# Patient Record
Sex: Male | Born: 1995 | Hispanic: Yes | Marital: Single | Smoking: Never smoker
Health system: Southern US, Community
[De-identification: ages and names within clinical notes are randomized; demographics above are authoritative.]

## PROBLEM LIST (undated history)

## (undated) DIAGNOSIS — Z789 Other specified health status: Secondary | ICD-10-CM

## (undated) DIAGNOSIS — S99911A Unspecified injury of right ankle, initial encounter: Secondary | ICD-10-CM

## (undated) HISTORY — PX: NASAL SEPTUM SURGERY: SHX37

---

## 2015-08-24 ENCOUNTER — Emergency Department (HOSPITAL_COMMUNITY): Payer: Self-pay

## 2015-08-24 ENCOUNTER — Emergency Department (HOSPITAL_COMMUNITY)
Admission: EM | Admit: 2015-08-24 | Discharge: 2015-08-25 | Disposition: A | Discharge status: Home / Self Care | Payer: Self-pay | Attending: Emergency Medicine | Admitting: Emergency Medicine

## 2015-08-24 ENCOUNTER — Encounter (HOSPITAL_COMMUNITY): Payer: Self-pay | Admitting: Emergency Medicine

## 2015-08-24 DIAGNOSIS — S82891A Other fracture of right lower leg, initial encounter for closed fracture: Secondary | ICD-10-CM

## 2015-08-24 DIAGNOSIS — Y9366 Activity, soccer: Secondary | ICD-10-CM | POA: Insufficient documentation

## 2015-08-24 DIAGNOSIS — X501XXA Overexertion from prolonged static or awkward postures, initial encounter: Secondary | ICD-10-CM | POA: Insufficient documentation

## 2015-08-24 DIAGNOSIS — Y929 Unspecified place or not applicable: Secondary | ICD-10-CM | POA: Insufficient documentation

## 2015-08-24 DIAGNOSIS — S82431A Displaced oblique fracture of shaft of right fibula, initial encounter for closed fracture: Secondary | ICD-10-CM | POA: Insufficient documentation

## 2015-08-24 DIAGNOSIS — Y998 Other external cause status: Secondary | ICD-10-CM | POA: Insufficient documentation

## 2015-08-24 MED ORDER — OXYCODONE-ACETAMINOPHEN 5-325 MG PO TABS
1.0000 | ORAL_TABLET | Freq: Once | ORAL | Status: AC
  Administered 2015-08-24: 1 via ORAL
  Filled 2015-08-24: qty 1

## 2015-08-24 MED ORDER — OXYCODONE-ACETAMINOPHEN 5-325 MG PO TABS: 1.0000 | ORAL_TABLET | Freq: Four times a day (QID) | ORAL | 0 refills | Status: DC | PRN

## 2015-08-24 MED ORDER — IBUPROFEN 800 MG PO TABS
800.0000 mg | ORAL_TABLET | Freq: Once | ORAL | Status: AC
  Administered 2015-08-24: 800 mg via ORAL
  Filled 2015-08-24: qty 1

## 2015-08-24 MED ORDER — NAPROXEN 500 MG PO TABS
500.0000 mg | ORAL_TABLET | Freq: Two times a day (BID) | ORAL | 0 refills | Status: DC
Start: 2015-08-24 — End: 2015-08-30

## 2015-08-24 NOTE — ED Provider Notes (Signed)
[facility]-EMERGENCY DEPT Provider Note   CSN: [id number] Arrival date & time: 08/24/15  2140  By signing my name below, I, Modena JanskyAlbert Thayil, attest that this documentation has been prepared under the direction and in the presence of non-physician practitioner, Cheri FowlerKayla Ausha Sieh, PA-C. Electronically Signed: Modena JanskyAlbert Thayil, Scribe. 08/24/2015. 10:15 PM.  History   Chief Complaint Chief Complaint  Patient presents with  . Ankle Injury    The history is provided by the patient. No language interpreter was used.   HPI Comments: Brad Kim is a 20 y.o. male who presents to the Emergency Department complaining of a right ankle injury that occurred about an hour ago. Pt states that he was playing soccer when he jumped up and came down rolling his right ankle.  He states he heard a "crack". He reports that he was not able to ambulate or bear weight following the incident due to constant severe right ankle pain with associated swelling. He states that he had tylenol PTA with minimal relief. He denies any numbness, weakness, or any other injury.    History reviewed. No pertinent past medical history.  There are no active problems to display for this patient.   History reviewed. No pertinent surgical history.   Home Medications    Prior to Admission medications   Medication Sig Start Date End Date Taking? Authorizing Provider  naproxen (NAPROSYN) 500 MG tablet Take 1 tablet (500 mg total) by mouth 2 (two) times daily. 08/24/15   Cheri FowlerKayla Emberlyn Burlison, PA-C  oxyCODONE-acetaminophen (PERCOCET/ROXICET) 5-325 MG tablet Take 1 tablet by mouth every 6 (six) hours as needed for severe pain. 08/24/15   Cheri FowlerKayla Christopherjame Carnell, PA-C    Family History No family history on file.  Social History Social History  Substance Use Topics  . Smoking status: Not on file  . Smokeless tobacco: Not on file  . Alcohol use Not on file     Allergies   Review of patient's allergies indicates not on file.   Review of Systems Review of Systems   Musculoskeletal: Positive for arthralgias, gait problem, joint swelling and myalgias.  Neurological: Negative for weakness and numbness.     Physical Exam Updated Vital Signs BP 111/79 (BP Location: Left Arm)   Pulse 86   Temp 100 F (37.8 C) (Oral)   Resp 16   SpO2 100%   Physical Exam  Constitutional: He is oriented to person, place, and time. He appears well-developed and well-nourished.  HENT:  Head: Normocephalic and atraumatic.  Right Ear: External ear normal.  Left Ear: External ear normal.  Eyes: Conjunctivae are normal. No scleral icterus.  Neck: No tracheal deviation present.  Cardiovascular:  Pulses:      Dorsalis pedis pulses are 2+ on the right side, and 2+ on the left side.  Brisk capillary refill.   Pulmonary/Chest: Effort normal. No respiratory distress.  Abdominal: He exhibits no distension.  Musculoskeletal: Normal range of motion. He exhibits edema and tenderness.  Right ankle: Moderate swelling.  Skin intact.  TTP over distal fibula.  Decreased ROM due to pain.   Neurological: He is alert and oriented to person, place, and time.  Deferred strength testing due to pain.  Sensation intact.   Skin: Skin is warm and dry.  Psychiatric: He has a normal mood and affect. His behavior is normal.     ED Treatments / Results  DIAGNOSTIC STUDIES: Oxygen Saturation is 100% on RA, normal by my interpretation.    COORDINATION OF CARE: 10:20 PM- Pt advised of plan  for treatment, which includes medication and radiology, and pt agrees.  Labs (all labs ordered are listed, but only abnormal results are displayed) Labs Reviewed - No data to display  EKG  EKG Interpretation None       Radiology Dg Tibia/fibula Right  Result Date: 08/24/2015 CLINICAL DATA:  Status post fall while playing soccer, with right lateral ankle pain. Initial encounter. EXAM: RIGHT TIBIA AND FIBULA - 2 VIEW COMPARISON:  None. FINDINGS: There is a displaced mildly comminuted oblique  fracture through the distal fibula, with 1/2 shaft width posterior displacement and mild widening of the interosseous space. There is resultant medial widening of the ankle mortise, with slight lateral talar tilt. Soft tissue swelling is noted about the ankle. No additional fractures are seen. The knee joint is grossly unremarkable. No knee joint effusion is identified. IMPRESSION: Displaced mildly comminuted oblique fracture through the distal fibula, with 1/2 shaft width posterior displacement and mild widening of the interosseous space. Resultant medial widening of the ankle mortise, with slight lateral talar tilt. Electronically Signed   By: Roanna Raider M.D.   On: 08/24/2015 23:22   Dg Ankle Complete Right  Result Date: 08/24/2015 CLINICAL DATA:  Twisting injury playing soccer. Lateral pain and swelling. EXAM: RIGHT ANKLE - COMPLETE 3+ VIEW COMPARISON:  None. FINDINGS: There is an oblique fracture of the distal fibula 6 cm from the end of the bone. Minimal comminution with a small third fragment. Major fracture fragments are separated by 3-4 mm. Very minimal lateral angulation. Widening of the ankle mortise is present. Avulsion fracture of the medial talus. Posterior lip of the tibia appears intact. IMPRESSION: Oblique fracture of the distal fibular diaphysis. Slight lateral angulation. Minimally comminuted. Widening of the ankle mortise. Avulsion fracture of the medial talus. Electronically Signed   By: Paulina Fusi M.D.   On: 08/24/2015 22:42    Procedures Procedures (including critical care time)  Medications Ordered in ED Medications  ibuprofen (ADVIL,MOTRIN) tablet 800 mg (800 mg Oral Given 08/24/15 2241)  oxyCODONE-acetaminophen (PERCOCET/ROXICET) 5-325 MG per tablet 1 tablet (1 tablet Oral Given 08/24/15 2332)     Initial Impression / Assessment and Plan / ED Course  I have reviewed the triage vital signs and the nursing notes.  Pertinent labs & imaging results that were available  during my care of the patient were reviewed by me and considered in my medical decision making (see chart for details).  Clinical Course   Patient X-Ray remarkable for Weber C fx of right ankle.  Compartment soft and compressible.  Neurovascularly intact.  Spoke with Dr. August Saucer, orthopedics, patient placed in posterior splint, NWB, and crutches.  Percocet and Naproxen for pain.  Pt advised to follow up with orthopedics.  Patient will be discharged home & is agreeable with above plan. Returns precautions discussed. Pt appears safe for discharge.    Final Clinical Impressions(s) / ED Diagnoses   Final diagnoses:  Ankle fracture, right, closed, initial encounter    New Prescriptions Discharge Medication List as of 08/24/2015 11:57 PM    START taking these medications   Details  naproxen (NAPROSYN) 500 MG tablet Take 1 tablet (500 mg total) by mouth 2 (two) times daily., Starting Sat 08/24/2015, Print    oxyCODONE-acetaminophen (PERCOCET/ROXICET) 5-325 MG tablet Take 1 tablet by mouth every 6 (six) hours as needed for severe pain., Starting Sat 08/24/2015, Print       I personally performed the services described in this documentation, which was scribed in my presence. The  recorded information has been reviewed and is accurate.     Cheri FowlerKayla Media Pizzini, PA-C 08/25/15 0007    Nira ConnPedro Eduardo Cardama, MD 08/26/15 [phone]

## 2015-08-24 NOTE — ED Triage Notes (Signed)
Pt states he was playing soccer and rolled his R ankle. He states it is painful to bear wt on ankle. He has swelling to top of R foot. No deformity. He took Tylenol at 2130. He rates his pain 6/10.

## 2015-08-26 ENCOUNTER — Other Ambulatory Visit: Payer: Self-pay | Admitting: Orthopedic Surgery

## 2015-08-28 ENCOUNTER — Encounter (HOSPITAL_COMMUNITY)
Admission: RE | Admit: 2015-08-28 | Discharge: 2015-08-28 | Disposition: A | Discharge status: Home / Self Care | Payer: Self-pay | Source: Ambulatory Visit | Attending: Orthopedic Surgery | Admitting: Orthopedic Surgery

## 2015-08-28 ENCOUNTER — Encounter (HOSPITAL_COMMUNITY): Payer: Self-pay

## 2015-08-28 DIAGNOSIS — Z01812 Encounter for preprocedural laboratory examination: Secondary | ICD-10-CM | POA: Insufficient documentation

## 2015-08-28 DIAGNOSIS — S82891A Other fracture of right lower leg, initial encounter for closed fracture: Secondary | ICD-10-CM | POA: Insufficient documentation

## 2015-08-28 DIAGNOSIS — X58XXXA Exposure to other specified factors, initial encounter: Secondary | ICD-10-CM | POA: Insufficient documentation

## 2015-08-28 HISTORY — DX: Other specified health status: Z78.9

## 2015-08-28 LAB — SURGICAL PCR SCREEN
MRSA, PCR: NEGATIVE
Staphylococcus aureus: POSITIVE — AB

## 2015-08-28 NOTE — Progress Notes (Addendum)
Cardiologist denies  Medical Md denies  Echo denies  Stress test denies  Heart cath denies  EKG denies  CXR denies  

## 2015-08-28 NOTE — Pre-Procedure Instructions (Signed)
Brad Kim  08/28/2015      [facility] [place], [place] - [address] [place] [address] Phone: [phone] Fax: [phone]    Your procedure is scheduled on Fri, Aug 25 @ 9:30 AM  [facility] Admitting at 7:30 AM  Call this number if you have problems the morning of surgery:  [phone]   Remember:  Do not eat food or drink liquids after midnight.  Take these medicines the morning of surgery with A SIP OF WATER Pain Pill(if needed)              Stop taking your Naprosyn. No Goody's,BC's,Advil,Motrin,Ibuprofen,Fish Oil,or any Herbal Medications.    Do not wear jewelry.  Do not wear lotions, powders, colognes, or deoderant.   Men may shave face and neck.  Do not bring valuables to the hospital.  [facility] is not responsible for any belongings or valuables.  Contacts, dentures or bridgework may not be worn into surgery.  Leave your suitcase in the car.  After surgery it may be brought to your room.  For patients admitted to the hospital, discharge time will be determined by your treatment team.  Patients discharged the day of surgery will not be allowed to drive home.    [facility]  Before surgery, you can play an important role.  Because skin is not sterile, your skin needs to be as free of germs as possible.  You can reduce the number of germs on you skin by washing with CHG (chlorahexidine gluconate) soap before surgery.  CHG is an antiseptic cleaner which kills germs and bonds with the skin to continue killing germs even after washing.  Please DO NOT use if you have an allergy to CHG or antibacterial soaps.  If your skin becomes reddened/irritated stop using the CHG and inform your nurse when you arrive at Short Stay.  Do not shave (including legs and underarms) for at least 48 hours prior to the first CHG shower.  You may shave your face.  Please  follow these instructions carefully:   1.  Shower with CHG Soap the night before surgery and the                                morning of Surgery.  2.  If you choose to wash your hair, wash your hair first as usual with your       normal shampoo.  3.  After you shampoo, rinse your hair and body thoroughly to remove the                      Shampoo.  4.  Use CHG as you would any other liquid soap.  You can apply chg directly       to the skin and wash gently with scrungie or a clean washcloth.  5.  Apply the CHG Soap to your body ONLY FROM THE NECK DOWN.        Do not use on open wounds or open sores.  Avoid contact with your eyes,       ears, mouth and genitals (private parts).  Wash genitals (private parts)       with your normal soap.  6.  Wash thoroughly, paying special attention to the area where your surgery  will be performed.  7.  Thoroughly rinse your body with warm water from the neck down.  8.  DO NOT shower/wash with your normal soap after using and rinsing off       the CHG Soap.  9.  Pat yourself dry with a clean towel.            10.  Wear clean pajamas.            11.  Place clean sheets on your bed the night of your first shower and do not        sleep with pets.  Day of Surgery  Do not apply any lotions/deoderants the morning of surgery.  Please wear clean clothes to the hospital/surgery center.     Please read over the following fact sheets that you were given. MRSA Information and Surgical Site Infection Prevention

## 2015-08-28 NOTE — Progress Notes (Signed)
+   staph, treat with nasal betadine DOS per protocol.

## 2015-08-30 ENCOUNTER — Ambulatory Visit (HOSPITAL_COMMUNITY): Payer: Self-pay | Admitting: Certified Registered Nurse Anesthetist

## 2015-08-30 ENCOUNTER — Encounter (HOSPITAL_COMMUNITY)
Admission: RE | Disposition: A | Discharge status: Home / Self Care | Payer: Self-pay | Source: Ambulatory Visit | Attending: Orthopedic Surgery

## 2015-08-30 ENCOUNTER — Ambulatory Visit (HOSPITAL_COMMUNITY): Payer: Self-pay

## 2015-08-30 ENCOUNTER — Encounter (HOSPITAL_COMMUNITY): Payer: Self-pay | Admitting: *Deleted

## 2015-08-30 ENCOUNTER — Ambulatory Visit (HOSPITAL_COMMUNITY)
Admission: RE | Admit: 2015-08-30 | Discharge: 2015-08-30 | Disposition: A | Discharge status: Home / Self Care | Payer: Self-pay | Source: Ambulatory Visit | Attending: Orthopedic Surgery | Admitting: Orthopedic Surgery

## 2015-08-30 DIAGNOSIS — S8261XA Displaced fracture of lateral malleolus of right fibula, initial encounter for closed fracture: Secondary | ICD-10-CM | POA: Insufficient documentation

## 2015-08-30 DIAGNOSIS — X58XXXA Exposure to other specified factors, initial encounter: Secondary | ICD-10-CM | POA: Insufficient documentation

## 2015-08-30 DIAGNOSIS — S93431A Sprain of tibiofibular ligament of right ankle, initial encounter: Secondary | ICD-10-CM | POA: Insufficient documentation

## 2015-08-30 DIAGNOSIS — Z419 Encounter for procedure for purposes other than remedying health state, unspecified: Secondary | ICD-10-CM

## 2015-08-30 HISTORY — PX: ORIF ANKLE FRACTURE: SHX5408

## 2015-08-30 SURGERY — OPEN REDUCTION INTERNAL FIXATION (ORIF) ANKLE FRACTURE
Anesthesia: Regional | Site: Ankle | Laterality: Right

## 2015-08-30 MED ORDER — FENTANYL CITRATE (PF) 100 MCG/2ML IJ SOLN
INTRAMUSCULAR | Status: AC
  Filled 2015-08-30: qty 2

## 2015-08-30 MED ORDER — FENTANYL CITRATE (PF) 100 MCG/2ML IJ SOLN
INTRAMUSCULAR | Status: AC
  Administered 2015-08-30: 50 ug via INTRAVENOUS
  Filled 2015-08-30: qty 2

## 2015-08-30 MED ORDER — CLONIDINE HCL (ANALGESIA) 100 MCG/ML EP SOLN
150.0000 ug | EPIDURAL | Status: DC
  Filled 2015-08-30: qty 1.5

## 2015-08-30 MED ORDER — MIDAZOLAM HCL 2 MG/2ML IJ SOLN
1.0000 mg | INTRAMUSCULAR | Status: DC | PRN
  Administered 2015-08-30: 2 mg via INTRAVENOUS

## 2015-08-30 MED ORDER — RIVAROXABAN 10 MG PO TABS: 10.0000 mg | ORAL_TABLET | Freq: Every day | ORAL | 0 refills | Status: DC

## 2015-08-30 MED ORDER — CHLORHEXIDINE GLUCONATE 4 % EX LIQD: 60.0000 mL | Freq: Once | CUTANEOUS | Status: DC

## 2015-08-30 MED ORDER — METHOCARBAMOL 500 MG PO TABS: 500.0000 mg | ORAL_TABLET | Freq: Four times a day (QID) | ORAL | 0 refills | Status: DC

## 2015-08-30 MED ORDER — PROMETHAZINE HCL 25 MG/ML IJ SOLN: 6.2500 mg | INTRAMUSCULAR | Status: DC | PRN

## 2015-08-30 MED ORDER — EPHEDRINE SULFATE 50 MG/ML IJ SOLN
INTRAMUSCULAR | Status: DC | PRN
  Administered 2015-08-30 (×5): 5 mg via INTRAVENOUS

## 2015-08-30 MED ORDER — BUPIVACAINE LIPOSOME 1.3 % IJ SUSP
20.0000 mL | INTRAMUSCULAR | Status: DC
  Filled 2015-08-30: qty 20

## 2015-08-30 MED ORDER — MIDAZOLAM HCL 2 MG/2ML IJ SOLN
INTRAMUSCULAR | Status: AC
  Administered 2015-08-30: 2 mg via INTRAVENOUS
  Filled 2015-08-30: qty 2

## 2015-08-30 MED ORDER — CEFAZOLIN SODIUM-DEXTROSE 2-4 GM/100ML-% IV SOLN
INTRAVENOUS | Status: AC
  Filled 2015-08-30: qty 100

## 2015-08-30 MED ORDER — 0.9 % SODIUM CHLORIDE (POUR BTL) OPTIME
TOPICAL | Status: DC | PRN
  Administered 2015-08-30: 2000 mL

## 2015-08-30 MED ORDER — OXYCODONE-ACETAMINOPHEN 5-325 MG PO TABS: 1.0000 | ORAL_TABLET | Freq: Four times a day (QID) | ORAL | 0 refills | Status: DC | PRN

## 2015-08-30 MED ORDER — FENTANYL CITRATE (PF) 100 MCG/2ML IJ SOLN
INTRAMUSCULAR | Status: DC | PRN
  Administered 2015-08-30 (×2): 50 ug via INTRAVENOUS

## 2015-08-30 MED ORDER — LACTATED RINGERS IV SOLN
INTRAVENOUS | Status: DC | PRN
  Administered 2015-08-30 (×2): via INTRAVENOUS

## 2015-08-30 MED ORDER — PROPOFOL 10 MG/ML IV BOLUS
INTRAVENOUS | Status: DC | PRN
  Administered 2015-08-30: 200 mg via INTRAVENOUS

## 2015-08-30 MED ORDER — LIDOCAINE HCL (CARDIAC) 20 MG/ML IV SOLN
INTRAVENOUS | Status: DC | PRN
  Administered 2015-08-30: 50 mg via INTRAVENOUS
  Administered 2015-08-30: 100 mg via INTRAVENOUS

## 2015-08-30 MED ORDER — PHENYLEPHRINE HCL 10 MG/ML IJ SOLN
INTRAMUSCULAR | Status: DC | PRN
  Administered 2015-08-30 (×3): 80 ug via INTRAVENOUS
  Administered 2015-08-30 (×3): 40 ug via INTRAVENOUS

## 2015-08-30 MED ORDER — CEFAZOLIN SODIUM-DEXTROSE 2-4 GM/100ML-% IV SOLN: 2.0000 g | INTRAVENOUS | Status: DC

## 2015-08-30 MED ORDER — LACTATED RINGERS IV SOLN
INTRAVENOUS | Status: DC
  Administered 2015-08-30: 08:00:00 via INTRAVENOUS

## 2015-08-30 MED ORDER — PROPOFOL 10 MG/ML IV BOLUS
INTRAVENOUS | Status: AC
  Filled 2015-08-30: qty 20

## 2015-08-30 MED ORDER — FENTANYL CITRATE (PF) 100 MCG/2ML IJ SOLN
50.0000 ug | INTRAMUSCULAR | Status: DC | PRN
  Administered 2015-08-30: 50 ug via INTRAVENOUS

## 2015-08-30 MED ORDER — DEXAMETHASONE SODIUM PHOSPHATE 10 MG/ML IJ SOLN
INTRAMUSCULAR | Status: DC | PRN
  Administered 2015-08-30: 10 mg via INTRAVENOUS

## 2015-08-30 MED ORDER — FENTANYL CITRATE (PF) 100 MCG/2ML IJ SOLN: 25.0000 ug | INTRAMUSCULAR | Status: DC | PRN

## 2015-08-30 SURGICAL SUPPLY — 74 items
BANDAGE ACE 4X5 VEL STRL LF (GAUZE/BANDAGES/DRESSINGS) ×3 IMPLANT
BANDAGE ACE 6X5 VEL STRL LF (GAUZE/BANDAGES/DRESSINGS) ×3 IMPLANT
BIT DRILL 3.5 QC 155 (BIT) ×2 IMPLANT
BIT DRILL 3.5 QC 155MM (BIT) ×1
BIT DRILL QC 2.7 6.3IN  SHORT (BIT) ×2
BIT DRILL QC 2.7 6.3IN SHORT (BIT) ×1 IMPLANT
BLADE SURG 10 STRL SS (BLADE) IMPLANT
BNDG COHESIVE 6X5 TAN STRL LF (GAUZE/BANDAGES/DRESSINGS) ×3 IMPLANT
BNDG ESMARK 4X9 LF (GAUZE/BANDAGES/DRESSINGS) ×3 IMPLANT
BNDG GAUZE ELAST 4 BULKY (GAUZE/BANDAGES/DRESSINGS) ×3 IMPLANT
CLOSURE STERI-STRIP 1/2X4 (GAUZE/BANDAGES/DRESSINGS) ×1
CLSR STERI-STRIP ANTIMIC 1/2X4 (GAUZE/BANDAGES/DRESSINGS) ×2 IMPLANT
COVER SURGICAL LIGHT HANDLE (MISCELLANEOUS) ×3 IMPLANT
CUFF TOURNIQUET SINGLE 34IN LL (TOURNIQUET CUFF) ×3 IMPLANT
DRAPE C-ARM 42X72 X-RAY (DRAPES) ×3 IMPLANT
DRAPE INCISE IOBAN 66X45 STRL (DRAPES) ×3 IMPLANT
DRAPE SURG 17X23 STRL (DRAPES) ×3 IMPLANT
DRAPE U-SHAPE 47X51 STRL (DRAPES) ×3 IMPLANT
DRSG PAD ABDOMINAL 8X10 ST (GAUZE/BANDAGES/DRESSINGS) ×3 IMPLANT
DURAPREP 26ML APPLICATOR (WOUND CARE) IMPLANT
ELECT REM PT RETURN 9FT ADLT (ELECTROSURGICAL) ×3
ELECTRODE REM PT RTRN 9FT ADLT (ELECTROSURGICAL) ×1 IMPLANT
GAUZE SPONGE 4X4 12PLY STRL (GAUZE/BANDAGES/DRESSINGS) ×3 IMPLANT
GAUZE XEROFORM 5X9 LF (GAUZE/BANDAGES/DRESSINGS) ×3 IMPLANT
GLOVE BIOGEL PI IND STRL 6.5 (GLOVE) ×1 IMPLANT
GLOVE BIOGEL PI IND STRL 7.0 (GLOVE) ×1 IMPLANT
GLOVE BIOGEL PI IND STRL 7.5 (GLOVE) ×1 IMPLANT
GLOVE BIOGEL PI IND STRL 8 (GLOVE) ×1 IMPLANT
GLOVE BIOGEL PI INDICATOR 6.5 (GLOVE) ×2
GLOVE BIOGEL PI INDICATOR 7.0 (GLOVE) ×2
GLOVE BIOGEL PI INDICATOR 7.5 (GLOVE) ×2
GLOVE BIOGEL PI INDICATOR 8 (GLOVE) ×2
GLOVE SURG ORTHO 8.0 STRL STRW (GLOVE) ×3 IMPLANT
GLOVE SURG SS PI 6.5 STRL IVOR (GLOVE) ×12 IMPLANT
GOWN STRL REUS W/ TWL LRG LVL3 (GOWN DISPOSABLE) ×3 IMPLANT
GOWN STRL REUS W/TWL LRG LVL3 (GOWN DISPOSABLE) ×6
KIT BASIN OR (CUSTOM PROCEDURE TRAY) ×3 IMPLANT
KIT ROOM TURNOVER OR (KITS) ×3 IMPLANT
MANIFOLD NEPTUNE II (INSTRUMENTS) ×3 IMPLANT
NEEDLE HYPO 25GX1X1/2 BEV (NEEDLE) ×3 IMPLANT
NS IRRIG 1000ML POUR BTL (IV SOLUTION) ×3 IMPLANT
PACK ORTHO EXTREMITY (CUSTOM PROCEDURE TRAY) ×3 IMPLANT
PAD ABD 8X10 STRL (GAUZE/BANDAGES/DRESSINGS) ×15 IMPLANT
PAD ARMBOARD 7.5X6 YLW CONV (MISCELLANEOUS) ×6 IMPLANT
PAD CAST 4YDX4 CTTN HI CHSV (CAST SUPPLIES) ×2 IMPLANT
PADDING CAST COTTON 4X4 STRL (CAST SUPPLIES) ×4
PADDING CAST COTTON 6X4 STRL (CAST SUPPLIES) ×3 IMPLANT
PLATE FIBULA DISTAL 7 HOLE (Plate) ×3 IMPLANT
SCREW CANC 5.0X14 (Screw) ×3 IMPLANT
SCREW LOCK 10X3.5XST NS (Screw) ×2 IMPLANT
SCREW LOCK 3.5X10 (Screw) ×4 IMPLANT
SCREW LOCK 3.5X60 (Screw) ×3 IMPLANT
SCREW LOCK 3.5X8 (Screw) ×3 IMPLANT
SCREW NL 3.5X55 (Screw) ×3 IMPLANT
SCREW NLCK 16X3.5XST CORT PRLC (Screw) ×2 IMPLANT
SCREW NON LOCK 3.5X12 (Screw) ×3 IMPLANT
SCREW NON LOCK 3.5X20 (Screw) ×3 IMPLANT
SCREW NONLOCK 3.5X10 (Screw) ×3 IMPLANT
SCREW NONLOCK 3.5X16 (Screw) ×4 IMPLANT
SCREW NONLOCK 3.5X18 (Screw) ×3 IMPLANT
SPONGE GAUZE 4X4 12PLY STER LF (GAUZE/BANDAGES/DRESSINGS) ×3 IMPLANT
STOCKINETTE IMPERVIOUS 9X36 MD (GAUZE/BANDAGES/DRESSINGS) ×3 IMPLANT
SUCTION FRAZIER HANDLE 10FR (MISCELLANEOUS) ×2
SUCTION TUBE FRAZIER 10FR DISP (MISCELLANEOUS) ×1 IMPLANT
SUT ETHILON 3 0 FSL (SUTURE) ×9 IMPLANT
SUT ETHILON 3 0 PS 1 (SUTURE) ×6 IMPLANT
SUT VIC AB 2-0 CT1 27 (SUTURE) ×6
SUT VIC AB 2-0 CT1 TAPERPNT 27 (SUTURE) ×3 IMPLANT
SUT VIC AB 2-0 CTB1 (SUTURE) ×3 IMPLANT
SYR CONTROL 10ML LL (SYRINGE) ×3 IMPLANT
TOWEL OR 17X24 6PK STRL BLUE (TOWEL DISPOSABLE) ×3 IMPLANT
TOWEL OR 17X26 10 PK STRL BLUE (TOWEL DISPOSABLE) ×3 IMPLANT
TUBE CONNECTING 12'X1/4 (SUCTIONS) ×1
TUBE CONNECTING 12X1/4 (SUCTIONS) ×2 IMPLANT

## 2015-08-30 NOTE — Anesthesia Procedure Notes (Signed)
Anesthesia Regional Block:  Popliteal block  Pre-Anesthetic Checklist: ,, timeout performed, Correct Patient, Correct Site, Correct Laterality, Correct Procedure, Correct Position, site marked, Risks and benefits discussed,  Surgical consent,  Pre-op evaluation,  At surgeon's request and post-op pain management  Laterality: Right  Prep: chloraprep       Needles:  Injection technique: Single-shot  Needle Type: Echogenic Needle     Needle Length: 5cm 5 cm     Additional Needles:  Procedures: ultrasound guided (picture in chart) Popliteal block Narrative:  Start time: 08/30/2015 8:45 AM End time: 08/30/2015 8:50 AM Injection made incrementally with aspirations every 25 mL.  Performed by: Personally  Anesthesiologist: Bonita QuinGUIDETTI, Kaili Castille S  Additional Notes: Patient tolerated procedure well

## 2015-08-30 NOTE — Anesthesia Preprocedure Evaluation (Addendum)
Anesthesia Evaluation  Patient identified by MRN, date of birth, ID band Patient awake    Reviewed: Allergy & Precautions, NPO status , Patient's Chart, lab work & pertinent test results  Airway Mallampati: I  TM Distance: >3 FB Neck ROM: Full    Dental no notable dental hx. (+) Dental Advisory Given   Pulmonary neg pulmonary ROS,    Pulmonary exam normal        Cardiovascular negative cardio ROS Normal cardiovascular exam     Neuro/Psych negative neurological ROS     GI/Hepatic negative GI ROS, Neg liver ROS,   Endo/Other  negative endocrine ROS  Renal/GU negative Renal ROS     Musculoskeletal negative musculoskeletal ROS (+)   Abdominal   Peds  Hematology negative hematology ROS (+)   Anesthesia Other Findings Day of surgery medications reviewed with the patient.  Reproductive/Obstetrics                            Anesthesia Physical Anesthesia Plan  ASA: I  Anesthesia Plan: General and Regional   Post-op Pain Management: GA combined w/ Regional for post-op pain   Induction: Intravenous  Airway Management Planned: LMA  Additional Equipment: None  Intra-op Plan:   Post-operative Plan: Extubation in OR  Informed Consent: I have reviewed the patients History and Physical, chart, labs and discussed the procedure including the risks, benefits and alternatives for the proposed anesthesia with the patient or authorized representative who has indicated his/her understanding and acceptance.   Dental advisory given  Plan Discussed with:   Anesthesia Plan Comments:         Anesthesia Quick Evaluation Popliteal block

## 2015-08-30 NOTE — H&P (Signed)
Eleno Jaci Standardieto is an 20 y.o. male.   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  Radiographs demonstrate Weber C fracture with widening of the medial clear space.  He denies any history of DVT or pulmonary embolism.  He works as a Education administratorpainter.  He presents now for operative management of unstable ankle fracture  Past Medical History:  Diagnosis Date  . Medical history non-contributory     Past Surgical History:  Procedure Laterality Date  . NASAL SEPTUM SURGERY     6 yrs ago    No family history on file. Social History:  reports that he has never smoked. He has never used smokeless tobacco. He reports that he does not drink alcohol or use drugs.  Allergies:  Allergies  Allergen Reactions  . No Known Allergies     Medications Prior to Admission  Medication Sig Dispense Refill  . naproxen (NAPROSYN) 500 MG tablet Take 1 tablet (500 mg total) by mouth 2 (two) times daily. 30 tablet 0  . oxyCODONE-acetaminophen (PERCOCET/ROXICET) 5-325 MG tablet Take 1 tablet by mouth every 6 (six) hours as needed for severe pain. 15 tablet 0    Results for orders placed or performed during the hospital encounter of 08/28/15 (from the past 48 hour(s))  Surgical pcr screen     Status: Abnormal   Collection Time: 08/28/15  3:17 PM  Result Value Ref Range   MRSA, PCR NEGATIVE NEGATIVE   Staphylococcus aureus POSITIVE (A) NEGATIVE    Comment:        The Xpert SA Assay (FDA approved for NASAL specimens in patients over 21 years of age), is one component of a comprehensive surveillance program.  Test performance has been validated by [facility] for patients greater than or equal to 21 year old. It is not intended to diagnose infection nor to guide or monitor treatment.    No results found.  ROS review of systems positive for joint pain otherwise negative  There were no vitals taken for this  visit. Physical Exam on physical examination the patient is well-developed well-nourished in no acute distress normal body mass index breast story effort normal heart rate normal mood and normal affect normal this lymphadenopathy in the skin neurologically alert and oriented 3 right leg demonstrates soft compartments palpable pedal pulses no masses lymph adenopathy or skin changes noted in the right leg region swelling is present in the lateral medial aspect of the ankle ankle dorsi flexion plantar flexion is intact  Assessment/Plan Impression is right ankle unstable fracture plan open reduction internal fixation with possible syndesmotic fixation.  Lag screw fixation for the long oblique fracture will be initiated with bridge plate fixation thereafter.  We will need to isolate and protect the superficial peroneal nerve sensory branch.  Anticipate a period of nonweightbearing along with aspirin for DVT prophylaxis  Cammy CopaEAN,Jhovani Griswold SCOTT, MD 08/30/2015, 7:22 AM

## 2015-08-30 NOTE — Anesthesia Procedure Notes (Signed)
Procedure Name: LMA Insertion Date/Time: 08/30/2015 10:50 AM Performed by: Tillman AbideHAWKINS, Mandy Peeks B Pre-anesthesia Checklist: Patient identified, Emergency Drugs available, Suction available and Patient being monitored Patient Re-evaluated:Patient Re-evaluated prior to inductionOxygen Delivery Method: Circle System Utilized Preoxygenation: Pre-oxygenation with 100% oxygen Intubation Type: IV induction Ventilation: Mask ventilation without difficulty LMA: LMA inserted LMA Size: 4.0 Number of attempts: 1 Airway Equipment and Method: Bite block Placement Confirmation: positive ETCO2 Tube secured with: Tape Dental Injury: Teeth and Oropharynx as per pre-operative assessment

## 2015-08-30 NOTE — Brief Op Note (Signed)
08/30/2015  12:28 PM  PATIENT:  Brad Kim  19 y.o. male  PRE-OPERATIVE DIAGNOSIS:  RIGHT ANKLE FRACTURE  POST-OPERATIVE DIAGNOSIS:  RIGHT ANKLE FRACTURE  PROCEDURE:  Procedure(s): OPEN REDUCTION INTERNAL FIXATION (ORIF) ANKLE FRACTURE with lateral malleolar fixation and syndesmotic fixation  SURGEON:  Surgeon(s): Cammy CopaScott Gregory Dean, MD  ASSISTANT:   ANESTHESIA:   general  EBL: 16 ml    Total I/O In: 1000 [I.V.:1000] Out: -   BLOOD ADMINISTERED: none  DRAINS: none   LOCAL MEDICATIONS USED:  none  SPECIMEN:  No Specimen  COUNTS:  YES  TOURNIQUET:  * No tourniquets in log *  DICTATION: .Other Dictation: Dictation Number done  PLAN OF CARE: Discharge to home after PACU  PATIENT DISPOSITION:  PACU - hemodynamically stable

## 2015-08-31 NOTE — Op Note (Signed)
NAMBerneta Sages:  Baltz, Kavi                 ACCOUNT NO.:  [id number]  MEDICAL RECORD NO.:  [id number]  LOCATION:  [facility]                         FACILITY:  [facility]  PHYSICIAN:  Burnard BuntingG. Scott Dean, M.D.    DATE OF BIRTH:  Oct 01, 1995  DATE OF PROCEDURE:  08/30/2015 DATE OF DISCHARGE:  08/30/2015                              OPERATIVE REPORT   PREOPERATIVE DIAGNOSIS:  Right ankle fracture lateral malleolus and syndesmosis disruption.  POSTOPERATIVE DIAGNOSIS:  Right ankle fracture lateral malleolus and syndesmosis disruption.  PROCEDURE:  Right ankle lateral malleolar fracture fixation with syndesmotic fixation.  SURGEON:  Burnard BuntingG. Scott Dean, M.D.  ASSISTANT:  April Neva SeatGreene, RNFA.  INDICATIONS:  Tomasita Morrowmmer is a 20 year old patient with right ankle fracture, unstable, presents for operative management after explanation risks and benefits.  PROCEDURE IN DETAIL:  The patient was brought to the operating room where general anesthetic was induced.  Preoperative antibiotics were given.  Time-out was called.  Right leg was prescrubbed with alcohol and Betadine, allowed to air dry, prepped with prep solution, draped in a sterile manner.  Time-out was called.  Collier Flowersoban was used to cover the operative field after sterile prepping and draping.  The ankle Esmarch utilized approximately 20 minutes.  Incision was made on the lateral aspect of the ankle of the lateral malleolar tip extending proximally for about 12 cm.  Care was taken to avoid injury to superficial and peroneal nerve.  The fracture was identified.  Periosteal elevation was performed on both sides.  Irrigation was performed, fracture reduced using lobster claw clamps x2.  Lag screw placed anterior proximal to distal posterior, good reduction achieved and confirmed in the AP and lateral planes under fluoroscopy.  Smith and Nephew plate was then applied with the locking holes distally and nonlocking screws proximally.  Following this, the syndesmosis was  assessed and found to be stable.  Two syndesmotic screws were placed through the reduced syndesmosis which was held by Darrick PennaKing Tong clamp with the foot in maximum dorsiflexion.  This allowed for 10 minutes.  Two screws were placed bicortical fashion.  Good reduction was achieved.  Ankle Esmarch released, 2 L of irrigating solution utilized.  Incision closed using 2- 0 Vicryl and 3-0 nylon.  A well-padded posterior splint applied.  The patient tolerated the procedure well without immediate complications, transferred to the recovery room in stable condition.     Burnard BuntingG. Scott Dean, M.D.    GSD/MEDQ  D:  08/30/2015  T:  08/31/2015  Job:  [id number]

## 2015-09-01 NOTE — Transfer of Care (Signed)
Immediate Anesthesia Transfer of Care Note  Patient: Brad Kim  Procedure(s) Performed: Procedure(s): OPEN REDUCTION INTERNAL FIXATION (ORIF) ANKLE FRACTURE (Right)  Patient Location: PACU  Anesthesia Type:General  Level of Consciousness: awake, alert , oriented and patient cooperative  Airway & Oxygen Therapy: Patient Spontanous Breathing and Patient connected to nasal cannula oxygen  Post-op Assessment: Report given to RN  Post vital signs: Reviewed and stable  Last Vitals:  Vitals:   08/30/15 1245 08/30/15 1300  BP: 125/64 123/63  Pulse: 81 88  Resp: 12 13  Temp:  36.7 C    Last Pain:  Vitals:   08/30/15 1300  TempSrc:   PainSc: 0-No pain      Patients Stated Pain Goal: 7 (08/30/15 0745)  Complications: No apparent anesthesia complications

## 2015-09-02 ENCOUNTER — Encounter (HOSPITAL_COMMUNITY): Payer: Self-pay | Admitting: Orthopedic Surgery

## 2015-09-03 NOTE — Anesthesia Postprocedure Evaluation (Signed)
Anesthesia Post Note  Patient: Brad Kim  Procedure(s) Performed: Procedure(s) (LRB): OPEN REDUCTION INTERNAL FIXATION (ORIF) ANKLE FRACTURE (Right)  Patient location during evaluation: PACU Anesthesia Type: General Level of consciousness: awake and alert Pain management: pain level controlled Vital Signs Assessment: post-procedure vital signs reviewed and stable Respiratory status: spontaneous breathing, nonlabored ventilation, respiratory function stable and patient connected to nasal cannula oxygen Cardiovascular status: blood pressure returned to baseline and stable Postop Assessment: no signs of nausea or vomiting Anesthetic complications: no     Last Vitals:  Vitals:   08/30/15 1245 08/30/15 1300  BP: 125/64 123/63  Pulse: 81 88  Resp: 12 13  Temp:  36.7 C    Last Pain:  Vitals:   08/30/15 0800  TempSrc: Oral   Pain Goal: Patients Stated Pain Goal: 7 (08/30/15 0745)               Brad Kim

## 2015-10-07 ENCOUNTER — Other Ambulatory Visit: Payer: Self-pay | Admitting: Orthopedic Surgery

## 2015-11-11 ENCOUNTER — Encounter (HOSPITAL_COMMUNITY)
Admission: RE | Admit: 2015-11-11 | Discharge: 2015-11-11 | Disposition: A | Discharge status: Home / Self Care | Payer: Self-pay | Source: Ambulatory Visit | Attending: Orthopedic Surgery | Admitting: Orthopedic Surgery

## 2015-11-11 ENCOUNTER — Encounter (HOSPITAL_COMMUNITY): Payer: Self-pay

## 2015-11-11 DIAGNOSIS — Z472 Encounter for removal of internal fixation device: Secondary | ICD-10-CM | POA: Insufficient documentation

## 2015-11-11 DIAGNOSIS — Z01818 Encounter for other preprocedural examination: Secondary | ICD-10-CM | POA: Insufficient documentation

## 2015-11-11 HISTORY — DX: Unspecified injury of right ankle, initial encounter: S99.911A

## 2015-11-11 LAB — CBC
HCT: 44.5 % (ref 39.0–52.0)
HEMOGLOBIN: 15 g/dL (ref 13.0–17.0)
MCH: 28 pg (ref 26.0–34.0)
MCHC: 33.7 g/dL (ref 30.0–36.0)
MCV: 83.2 fL (ref 78.0–100.0)
Platelets: 217 10*3/uL (ref 150–400)
RBC: 5.35 MIL/uL (ref 4.22–5.81)
RDW: 13.6 % (ref 11.5–15.5)
WBC: 9.2 10*3/uL (ref 4.0–10.5)

## 2015-11-11 LAB — SURGICAL PCR SCREEN
MRSA, PCR: NEGATIVE
Staphylococcus aureus: POSITIVE — AB

## 2015-11-11 NOTE — H&P (Signed)
Brad Kim is an 20 y.o. male.   Chief Complaint: Right ankle pain HPI: Brad Kim is a 20 year old patient with right ankle pain.  Had syndesmotic injury and ankle fracture about 2-3 months ago.  Presents now for hardware removal and possible placement of tight rope  Past Medical History:  Diagnosis Date  . Medical history non-contributory   . Right ankle injury    syndesmotic    Past Surgical History:  Procedure Laterality Date  . NASAL SEPTUM SURGERY     6 yrs ago  . ORIF ANKLE FRACTURE Right 08/30/2015   Procedure: OPEN REDUCTION INTERNAL FIXATION (ORIF) ANKLE FRACTURE;  Surgeon: Cammy CopaScott Messiyah Waterson, MD;  Location: [facility] OR;  Service: Orthopedics;  Laterality: Right;    No family history on file. Social History:  reports that he has never smoked. He has never used smokeless tobacco. He reports that he does not drink alcohol or use drugs.  Allergies:  Allergies  Allergen Reactions  . No Known Allergies     No prescriptions prior to admission.    Results for orders placed or performed during the hospital encounter of 11/11/15 (from the past 48 hour(s))  Surgical pcr screen     Status: Abnormal   Collection Time: 11/11/15  1:12 PM  Result Value Ref Range   MRSA, PCR NEGATIVE NEGATIVE   Staphylococcus aureus POSITIVE (A) NEGATIVE    Comment:        The Xpert SA Assay (FDA approved for NASAL specimens in patients over 21 years of age), is one component of a comprehensive surveillance program.  Test performance has been validated by [facility] for patients greater than or equal to 21 year old. It is not intended to diagnose infection nor to guide or monitor treatment.   CBC     Status: None   Collection Time: 11/11/15  1:12 PM  Result Value Ref Range   WBC 9.2 4.0 - 10.5 K/uL   RBC 5.35 4.22 - 5.81 MIL/uL   Hemoglobin 15.0 13.0 - 17.0 g/dL   HCT 11.944.5 14.739.0 - 82.952.0 %   MCV 83.2 78.0 - 100.0 fL   MCH 28.0 26.0 - 34.0 pg   MCHC 33.7 30.0 - 36.0 g/dL   RDW 56.213.6 13.011.5 -  86.515.5 %   Platelets 217 150 - 400 K/uL   No results found.  Review of Systems  Musculoskeletal: Positive for joint pain.  All other systems reviewed and are negative.   There were no vitals taken for this visit. Physical Exam  Constitutional: He appears well-developed.  HENT:  Head: Normocephalic.  Eyes: Pupils are equal, round, and reactive to light.  Neck: Normal range of motion.  Cardiovascular: Normal rate.   Respiratory: Effort normal.  Neurological: He is alert.  Skin: Skin is warm.  Psychiatric: He has a normal mood and affect.   right ankle demonstrates well-healed surgical incisions palpable pedal pulses 3 reasonable ankle dorsi flexion plantar flexion strength.  Syndesmosis is stable  Assessment/Plan Impression is retained hardware following syndesmotic fixation plan removal of syndesmotic fixation with possible placement of tight rope depending on how this feels.  It is been long enough since surgery that it should be healed but we will make that assessment at the time of surgery.  Risks and benefits of surgery discussed including not limited to infection or vessel damage potential need for more surgery.  All questions answered  Burnard BuntingG Scott Maheen Cwikla, MD 11/11/2015, 5:37 PM

## 2015-11-11 NOTE — Progress Notes (Signed)
Pt denies SOB, chest pain, and being under the care of a cardiologist. Pt denies having a stress test, echo and cardiac cath. Pt denies having any recent labs. Pt denies having an EKG and chest x ray within the last year. Pt has + Staph Surgical PCR result; please treat with Betadine nasal swab ( per protocol) on DOS.

## 2015-11-11 NOTE — Pre-Procedure Instructions (Signed)
    Berneta Sagesmmer Barich  11/11/2015      [facility] [place] - [address]  [place] [id number] Phone: [phone] Fax: [phone]    Your procedure is scheduled on Tuesday, November 12, 2015  [facility] Admitting at 1:00 P.M.  Call this number if you have problems the morning of surgery:  [phone]   Remember:  Do not eat food or drink liquids after midnight.  Take these medicines the morning of surgery with A SIP OF WATER : None Stop taking Aspirin, vitamins, fish oil and herbal medications. Do not take any NSAIDs ie: Ibuprofen, Advil, Naproxen, BC and Goody Powder or any medication containing Aspirin ; stop now.  Do not wear jewelry, make-up or nail polish.  Do not wear lotions, powders, or perfumes, or deoderant.  Do not shave 48 hours prior to surgery.  Men may shave face and neck.  Do not bring valuables to the hospital.  [facility] is not responsible for any belongings or valuables.  Contacts, dentures or bridgework may not be worn into surgery.  Leave your suitcase in the car.  After surgery it may be brought to your room. Patients discharged the day of surgery will not be allowed to drive home.  Special instructions: Shower tonight and the morning of surgery with CHG. Please read over the following fact sheets that you were given. Pain Booklet, Coughing and Deep Breathing and Surgical Site Infection Prevention

## 2015-11-12 ENCOUNTER — Ambulatory Visit (HOSPITAL_COMMUNITY): Payer: Self-pay | Admitting: Anesthesiology

## 2015-11-12 ENCOUNTER — Ambulatory Visit (HOSPITAL_COMMUNITY)
Admission: RE | Admit: 2015-11-12 | Discharge: 2015-11-12 | Disposition: A | Discharge status: Home / Self Care | Payer: Self-pay | Source: Ambulatory Visit | Attending: Orthopedic Surgery | Admitting: Orthopedic Surgery

## 2015-11-12 ENCOUNTER — Encounter (HOSPITAL_COMMUNITY): Payer: Self-pay | Admitting: Anesthesiology

## 2015-11-12 ENCOUNTER — Encounter (HOSPITAL_COMMUNITY)
Admission: RE | Disposition: A | Discharge status: Home / Self Care | Payer: Self-pay | Source: Ambulatory Visit | Attending: Orthopedic Surgery

## 2015-11-12 DIAGNOSIS — X58XXXD Exposure to other specified factors, subsequent encounter: Secondary | ICD-10-CM | POA: Insufficient documentation

## 2015-11-12 DIAGNOSIS — T84126A Displacement of internal fixation device of bone of right lower leg, initial encounter: Secondary | ICD-10-CM

## 2015-11-12 DIAGNOSIS — S93431D Sprain of tibiofibular ligament of right ankle, subsequent encounter: Secondary | ICD-10-CM | POA: Insufficient documentation

## 2015-11-12 HISTORY — PX: HARDWARE REMOVAL: SHX979

## 2015-11-12 SURGERY — REMOVAL, HARDWARE
Anesthesia: General | Site: Ankle | Laterality: Right

## 2015-11-12 MED ORDER — DEXAMETHASONE SODIUM PHOSPHATE 10 MG/ML IJ SOLN
INTRAMUSCULAR | Status: AC
  Filled 2015-11-12: qty 1

## 2015-11-12 MED ORDER — FENTANYL CITRATE (PF) 100 MCG/2ML IJ SOLN
INTRAMUSCULAR | Status: AC
  Filled 2015-11-12: qty 2

## 2015-11-12 MED ORDER — PROPOFOL 10 MG/ML IV BOLUS
INTRAVENOUS | Status: AC
  Filled 2015-11-12: qty 20

## 2015-11-12 MED ORDER — PROPOFOL 10 MG/ML IV BOLUS
INTRAVENOUS | Status: DC | PRN
  Administered 2015-11-12: 200 mg via INTRAVENOUS
  Administered 2015-11-12 (×2): 50 mg via INTRAVENOUS

## 2015-11-12 MED ORDER — HYDROMORPHONE HCL 2 MG/ML IJ SOLN
INTRAMUSCULAR | Status: AC
  Filled 2015-11-12: qty 1

## 2015-11-12 MED ORDER — 0.9 % SODIUM CHLORIDE (POUR BTL) OPTIME
TOPICAL | Status: DC | PRN
  Administered 2015-11-12: 1000 mL

## 2015-11-12 MED ORDER — CHLORHEXIDINE GLUCONATE 4 % EX LIQD: 60.0000 mL | Freq: Once | CUTANEOUS | Status: DC

## 2015-11-12 MED ORDER — MIDAZOLAM HCL 2 MG/2ML IJ SOLN
INTRAMUSCULAR | Status: AC
  Filled 2015-11-12: qty 2

## 2015-11-12 MED ORDER — CEFAZOLIN SODIUM-DEXTROSE 2-4 GM/100ML-% IV SOLN
2.0000 g | INTRAVENOUS | Status: AC
  Administered 2015-11-12: 2 g via INTRAVENOUS
  Filled 2015-11-12: qty 100

## 2015-11-12 MED ORDER — LACTATED RINGERS IV SOLN
INTRAVENOUS | Status: DC
  Administered 2015-11-12 (×2): via INTRAVENOUS

## 2015-11-12 MED ORDER — BUPIVACAINE HCL 0.25 % IJ SOLN
INTRAMUSCULAR | Status: DC | PRN
  Administered 2015-11-12: 10 mL

## 2015-11-12 MED ORDER — ONDANSETRON HCL 4 MG/2ML IJ SOLN
INTRAMUSCULAR | Status: DC | PRN
Start: 2015-11-12 — End: 2015-11-12
  Administered 2015-11-12: 4 mg via INTRAVENOUS

## 2015-11-12 MED ORDER — LIDOCAINE HCL (CARDIAC) 20 MG/ML IV SOLN
INTRAVENOUS | Status: DC | PRN
  Administered 2015-11-12: 60 mg via INTRAVENOUS

## 2015-11-12 MED ORDER — FENTANYL CITRATE (PF) 100 MCG/2ML IJ SOLN
INTRAMUSCULAR | Status: DC | PRN
  Administered 2015-11-12 (×2): 50 ug via INTRAVENOUS
  Administered 2015-11-12: 100 ug via INTRAVENOUS

## 2015-11-12 MED ORDER — BUPIVACAINE HCL (PF) 0.25 % IJ SOLN
INTRAMUSCULAR | Status: AC
  Filled 2015-11-12: qty 30

## 2015-11-12 MED ORDER — DEXAMETHASONE SODIUM PHOSPHATE 10 MG/ML IJ SOLN
INTRAMUSCULAR | Status: DC | PRN
  Administered 2015-11-12: 10 mg via INTRAVENOUS

## 2015-11-12 MED ORDER — ONDANSETRON HCL 4 MG/2ML IJ SOLN
INTRAMUSCULAR | Status: AC
  Filled 2015-11-12: qty 2

## 2015-11-12 MED ORDER — OXYCODONE-ACETAMINOPHEN 5-325 MG PO TABS: 1.0000 | ORAL_TABLET | Freq: Four times a day (QID) | ORAL | 0 refills | Status: DC | PRN

## 2015-11-12 MED ORDER — LIDOCAINE 2% (20 MG/ML) 5 ML SYRINGE
INTRAMUSCULAR | Status: AC
  Filled 2015-11-12: qty 5

## 2015-11-12 MED ORDER — HYDROMORPHONE HCL 1 MG/ML IJ SOLN
0.2500 mg | INTRAMUSCULAR | Status: DC | PRN
Start: 2015-11-12 — End: 2015-11-12
  Administered 2015-11-12: 0.5 mg via INTRAVENOUS

## 2015-11-12 MED ORDER — MIDAZOLAM HCL 5 MG/5ML IJ SOLN
INTRAMUSCULAR | Status: DC | PRN
  Administered 2015-11-12: 2 mg via INTRAVENOUS

## 2015-11-12 SURGICAL SUPPLY — 50 items
BANDAGE ACE 4X5 VEL STRL LF (GAUZE/BANDAGES/DRESSINGS) IMPLANT
BANDAGE ACE 6X5 VEL STRL LF (GAUZE/BANDAGES/DRESSINGS) IMPLANT
BANDAGE ESMARK 6X9 LF (GAUZE/BANDAGES/DRESSINGS) IMPLANT
BNDG COHESIVE 4X5 TAN STRL (GAUZE/BANDAGES/DRESSINGS) ×3 IMPLANT
BNDG ESMARK 4X9 LF (GAUZE/BANDAGES/DRESSINGS) ×3 IMPLANT
BNDG ESMARK 6X9 LF (GAUZE/BANDAGES/DRESSINGS)
BNDG GAUZE ELAST 4 BULKY (GAUZE/BANDAGES/DRESSINGS) ×3 IMPLANT
COVER SURGICAL LIGHT HANDLE (MISCELLANEOUS) ×3 IMPLANT
CUFF TOURNIQUET SINGLE 34IN LL (TOURNIQUET CUFF) ×3 IMPLANT
CUFF TOURNIQUET SINGLE 44IN (TOURNIQUET CUFF) IMPLANT
DECANTER SPIKE VIAL GLASS SM (MISCELLANEOUS) ×3 IMPLANT
DRAPE INCISE IOBAN 66X45 STRL (DRAPES) ×3 IMPLANT
DRAPE OEC MINIVIEW 54X84 (DRAPES) ×3 IMPLANT
DRSG AQUACEL AG ADV 3.5X 4 (GAUZE/BANDAGES/DRESSINGS) ×3 IMPLANT
DRSG EMULSION OIL 3X3 NADH (GAUZE/BANDAGES/DRESSINGS) ×3 IMPLANT
DRSG PAD ABDOMINAL 8X10 ST (GAUZE/BANDAGES/DRESSINGS) ×3 IMPLANT
ELECT REM PT RETURN 9FT ADLT (ELECTROSURGICAL) ×3
ELECTRODE REM PT RTRN 9FT ADLT (ELECTROSURGICAL) ×1 IMPLANT
GAUZE SPONGE 4X4 12PLY STRL (GAUZE/BANDAGES/DRESSINGS) ×3 IMPLANT
GAUZE XEROFORM 1X8 LF (GAUZE/BANDAGES/DRESSINGS) ×3 IMPLANT
GLOVE BIOGEL PI IND STRL 6.5 (GLOVE) ×2 IMPLANT
GLOVE BIOGEL PI IND STRL 7.5 (GLOVE) ×1 IMPLANT
GLOVE BIOGEL PI IND STRL 8 (GLOVE) ×1 IMPLANT
GLOVE BIOGEL PI INDICATOR 6.5 (GLOVE) ×4
GLOVE BIOGEL PI INDICATOR 7.5 (GLOVE) ×2
GLOVE BIOGEL PI INDICATOR 8 (GLOVE) ×2
GLOVE SURG ORTHO 8.0 STRL STRW (GLOVE) ×3 IMPLANT
GLOVE SURG SS PI 6.5 STRL IVOR (GLOVE) ×6 IMPLANT
GOWN STRL REUS W/ TWL LRG LVL3 (GOWN DISPOSABLE) ×3 IMPLANT
GOWN STRL REUS W/TWL LRG LVL3 (GOWN DISPOSABLE) ×6
KIT BASIN OR (CUSTOM PROCEDURE TRAY) ×3 IMPLANT
KIT ROOM TURNOVER OR (KITS) ×3 IMPLANT
NS IRRIG 1000ML POUR BTL (IV SOLUTION) ×3 IMPLANT
PACK ORTHO EXTREMITY (CUSTOM PROCEDURE TRAY) ×3 IMPLANT
PAD ARMBOARD 7.5X6 YLW CONV (MISCELLANEOUS) ×6 IMPLANT
PAD CAST 4YDX4 CTTN HI CHSV (CAST SUPPLIES) ×2 IMPLANT
PADDING CAST COTTON 4X4 STRL (CAST SUPPLIES) ×4
PADDING CAST COTTON 6X4 STRL (CAST SUPPLIES) ×6 IMPLANT
SUCTION FRAZIER TIP 10 FR DISP (SUCTIONS) ×3 IMPLANT
SUT ETHILON 3 0 PS 1 (SUTURE) ×3 IMPLANT
SUT VIC AB 0 CT1 27 (SUTURE)
SUT VIC AB 0 CT1 27XBRD ANBCTR (SUTURE) IMPLANT
SUT VIC AB 2-0 CT1 27 (SUTURE) ×2
SUT VIC AB 2-0 CT1 TAPERPNT 27 (SUTURE) ×1 IMPLANT
SUT VIC AB 3-0 FS2 27 (SUTURE) ×3 IMPLANT
SYR CONTROL 10ML LL (SYRINGE) ×3 IMPLANT
TOWEL OR 17X24 6PK STRL BLUE (TOWEL DISPOSABLE) ×3 IMPLANT
TOWEL OR 17X26 10 PK STRL BLUE (TOWEL DISPOSABLE) ×3 IMPLANT
TUBE CONNECTING 12'X1/4 (SUCTIONS) ×1
TUBE CONNECTING 12X1/4 (SUCTIONS) ×2 IMPLANT

## 2015-11-12 NOTE — Anesthesia Preprocedure Evaluation (Addendum)
Anesthesia Evaluation  Patient identified by MRN, date of birth, ID band Patient awake    Reviewed: Allergy & Precautions, H&P , NPO status , Patient's Chart, lab work & pertinent test results  Airway Mallampati: I  TM Distance: >3 FB Neck ROM: Full    Dental no notable dental hx. (+) Teeth Intact, Dental Advisory Given   Pulmonary neg pulmonary ROS,    Pulmonary exam normal breath sounds clear to auscultation       Cardiovascular negative cardio ROS   Rhythm:Regular Rate:Normal     Neuro/Psych negative neurological ROS  negative psych ROS   GI/Hepatic negative GI ROS, Neg liver ROS,   Endo/Other  negative endocrine ROS  Renal/GU negative Renal ROS  negative genitourinary   Musculoskeletal   Abdominal   Peds  Hematology negative hematology ROS (+)   Anesthesia Other Findings   Reproductive/Obstetrics negative OB ROS                            Anesthesia Physical Anesthesia Plan  ASA: I  Anesthesia Plan: General   Post-op Pain Management:    Induction: Intravenous  Airway Management Planned: LMA and Oral ETT  Additional Equipment:   Intra-op Plan:   Post-operative Plan: Extubation in OR  Informed Consent: I have reviewed the patients History and Physical, chart, labs and discussed the procedure including the risks, benefits and alternatives for the proposed anesthesia with the patient or authorized representative who has indicated his/her understanding and acceptance.   Dental advisory given  Plan Discussed with: CRNA  Anesthesia Plan Comments:         Anesthesia Quick Evaluation

## 2015-11-12 NOTE — Progress Notes (Signed)
Report given to taylor rn as caregiver 

## 2015-11-12 NOTE — Anesthesia Procedure Notes (Signed)
Procedure Name: LMA Insertion Date/Time: 11/12/2015 11:38 AM Performed by: Romie MinusOCK, Kaizer Dissinger K Pre-anesthesia Checklist: Patient identified, Emergency Drugs available, Suction available and Patient being monitored Patient Re-evaluated:Patient Re-evaluated prior to inductionOxygen Delivery Method: Circle System Utilized Preoxygenation: Pre-oxygenation with 100% oxygen Intubation Type: IV induction Ventilation: Mask ventilation without difficulty LMA: LMA inserted LMA Size: 4.0 Number of attempts: 1 Airway Equipment and Method: Bite block Placement Confirmation: positive ETCO2 and breath sounds checked- equal and bilateral Tube secured with: Tape Dental Injury: Teeth and Oropharynx as per pre-operative assessment

## 2015-11-12 NOTE — Transfer of Care (Signed)
Immediate Anesthesia Transfer of Care Note  Patient: Brad Kim  Procedure(s) Performed: Procedure(s): RIGHT ANKLE HARDWARE REMOVAL (Right)  Patient Location: PACU  Anesthesia Type:General  Level of Consciousness: awake, oriented and patient cooperative  Airway & Oxygen Therapy: Patient Spontanous Breathing and Patient connected to nasal cannula oxygen  Post-op Assessment: Report given to RN and Post -op Vital signs reviewed and stable  Post vital signs: Reviewed  Last Vitals:  Vitals:   11/12/15 0833  BP: 129/67  Pulse: 65  Resp: 20  Temp: 37.1 C    Last Pain:  Vitals:   11/12/15 0833  TempSrc: Oral         Complications: No apparent anesthesia complications

## 2015-11-12 NOTE — Op Note (Signed)
11/12/2015  12:35 PM  PATIENT:  Brad Kim  20 y.o. male  PRE-OPERATIVE DIAGNOSIS:  Right Ankle Syndesmotic Injury  POST-OPERATIVE DIAGNOSIS:  Right Ankle Syndesmotic Injury  PROCEDURE:  Procedure(s): RIGHT ANKLE HARDWARE REMOVAL  SURGEON:  Surgeon(s): Cammy CopaScott Janett Kamath, MD  ASSISTANT: Patrick Jupiterarla Bethune rnfa  ANESTHESIA:   general  EBL: 10 ml    Total I/O In: 1000 [I.V.:1000] Out: -   BLOOD ADMINISTERED: none  DRAINS: none   LOCAL MEDICATIONS USED:  Marcaine plain  SPECIMEN:  No Specimen  COUNTS:  YES  TOURNIQUET:    DICTATION: .Other Dictation: Dictation Number [id number]  PLAN OF CARE: Discharge to home after PACU  PATIENT DISPOSITION:  PACU - hemodynamically stable

## 2015-11-12 NOTE — Anesthesia Postprocedure Evaluation (Signed)
Anesthesia Post Note  Patient: Brad Kim  Procedure(s) Performed: Procedure(s) (LRB): RIGHT ANKLE HARDWARE REMOVAL (Right)  Patient location during evaluation: PACU Anesthesia Type: General Level of consciousness: awake and alert Pain management: pain level controlled Vital Signs Assessment: post-procedure vital signs reviewed and stable Respiratory status: spontaneous breathing, nonlabored ventilation and respiratory function stable Cardiovascular status: blood pressure returned to baseline and stable Postop Assessment: no signs of nausea or vomiting Anesthetic complications: no    Last Vitals:  Vitals:   11/12/15 1330 11/12/15 1345  BP: 100/69 102/75  Pulse: (!) 59 64  Resp: 10 11  Temp:      Last Pain:  Vitals:   11/12/15 1330  TempSrc:   PainSc: Asleep                 Cary Wilford,W. EDMOND

## 2015-11-12 NOTE — Progress Notes (Signed)
Nasal betadine given 

## 2015-11-13 ENCOUNTER — Encounter (HOSPITAL_COMMUNITY): Payer: Self-pay | Admitting: Orthopedic Surgery

## 2015-11-13 NOTE — Op Note (Signed)
NAMBerneta Sages:  [name], [name]                 ACCOUNT NO.:  [id number]  MEDICAL RECORD NO.:  [id number]  LOCATION:  [facility]                         FACILITY:  [facility]  PHYSICIAN:  Burnard BuntingG. Scott Griffin Gerrard, M.D.    DATE OF BIRTH:  1995/03/20  DATE OF PROCEDURE: DATE OF DISCHARGE:  11/12/2015                              OPERATIVE REPORT   PREOPERATIVE DIAGNOSIS:  Retained hardware, right ankle.  POSTOPERATIVE DIAGNOSIS:  Retained hardware, right ankle.  PROCEDURE:  Removal of syndesmotic screws x2, right ankle.  SURGEON:  Burnard BuntingG. Scott Deseree Zemaitis, M.D.  ASSISTANT:  Patrick Jupiterarla Bethune, RNFA  INDICATIONS:  Brad Morrowmmer is a 20 year old patient, 2-1/2 months out syndesmotic fixation of right ankle, presents for operative management after explanation of risks and benefits.  PROCEDURE IN DETAIL:  The patient was brought to the operating room where general anesthetic was induced.  Preoperative antibiotics were administered.  Time-out was called.  The right leg was prescrubbed with alcohol and Betadine, allowed to air dry.  Prepped with DuraPrep solution, draped in sterile manner.  Collier Flowersoban was used to cover the operative field.  Ankle Esmarch was utilized for approximately 10 minutes.  An incision was made after fluoroscopic localization over the 2 screw heads for the syndesmotic screws.  Skin and subcutaneous tissue were sharply divided.  The screws were removed.  The ankle was taken through range of motion as well as significant stressing of the syndesmosis, and the syndesmosis was found to be stable.  The decision was made not to put in a tight rope.  At this time, thorough irrigation was performed.  Ankle Esmarch released.  Bleeding points were encountered and controlled using electrocautery.  Skin edges anesthetized and then closed using 3-0 Vicryl, 3-0 nylon.  Impervious dressing placed.  The patient tolerated the procedure well without immediate complications, transferred to the recovery room in stable condition.     Burnard BuntingG.  Scott Nesanel Aguila, M.D.     GSD/MEDQ  D:  11/12/2015  T:  11/13/2015  Job:  [id number]

## 2015-11-21 ENCOUNTER — Ambulatory Visit (INDEPENDENT_AMBULATORY_CARE_PROVIDER_SITE_OTHER): Payer: Self-pay | Admitting: Orthopedic Surgery

## 2015-11-21 ENCOUNTER — Encounter (INDEPENDENT_AMBULATORY_CARE_PROVIDER_SITE_OTHER): Payer: Self-pay | Admitting: Orthopedic Surgery

## 2015-11-21 DIAGNOSIS — S82831D Other fracture of upper and lower end of right fibula, subsequent encounter for closed fracture with routine healing: Secondary | ICD-10-CM

## 2015-11-21 DIAGNOSIS — S82301D Unspecified fracture of lower end of right tibia, subsequent encounter for closed fracture with routine healing: Secondary | ICD-10-CM

## 2015-11-21 NOTE — Progress Notes (Signed)
   Post-Op Visit Note   Patient: Brad Kim           Date of Birth: 05/07/1995           MRN: [id number] Visit Date: 11/21/2015 PCP: No PCP Per Patient   Assessment & Plan:  Chief Complaint:  Chief Complaint  Patient presents with  . Right Ankle - Routine Post Op   Visit Diagnoses:  1. Closed fracture of distal end of fibula with tibia, right, with routine healing, subsequent encounter     Plan: MR is a 20 year old patient with right syndesmotic hardware removal.  He's been doing well.  Incision is intact and the sutures are removed today.  Okay to transition out of the fracture boot and off crutches over the next week.  Okay to try to return to work after Thanksgiving.  I'll see him back in about 2 weeks after his return to work for final clinical check and radiographs and release.  Today's syndesmosis feels very stable.  Follow-Up Instructions: No Follow-up on file.   Orders:  No orders of the defined types were placed in this encounter.  No orders of the defined types were placed in this encounter.    PMFS History: There are no active problems to display for this patient.  Past Medical History:  Diagnosis Date  . Medical history non-contributory   . Right ankle injury    syndesmotic    No family history on file.  Past Surgical History:  Procedure Laterality Date  . HARDWARE REMOVAL Right 11/12/2015   Procedure: RIGHT ANKLE HARDWARE REMOVAL;  Surgeon: Cammy CopaScott Kahdijah Errickson, MD;  [facility];  Service: Orthopedics;  Laterality: Right;  . NASAL SEPTUM SURGERY     6 yrs ago  . ORIF ANKLE FRACTURE Right 08/30/2015   Procedure: OPEN REDUCTION INTERNAL FIXATION (ORIF) ANKLE FRACTURE;  Surgeon: Cammy CopaScott Leandre Wien, MD;  Location: [facility] OR;  Service: Orthopedics;  Laterality: Right;   Social History   Occupational History  . Not on file.   Social History Main Topics  . Smoking status: Never Smoker  . Smokeless tobacco: Never Used  . Alcohol use No  . Drug use: No  .  Sexual activity: Not on file

## 2015-11-22 ENCOUNTER — Inpatient Hospital Stay (INDEPENDENT_AMBULATORY_CARE_PROVIDER_SITE_OTHER): Payer: Self-pay | Admitting: Orthopedic Surgery

## 2015-12-19 ENCOUNTER — Ambulatory Visit (INDEPENDENT_AMBULATORY_CARE_PROVIDER_SITE_OTHER): Payer: Self-pay | Admitting: Orthopedic Surgery

## 2020-09-14 ENCOUNTER — Ambulatory Visit (HOSPITAL_COMMUNITY)
Admission: EM | Admit: 2020-09-14 | Discharge: 2020-09-14 | Disposition: A | Discharge status: Home / Self Care | Payer: Self-pay | Attending: Family Medicine | Admitting: Family Medicine

## 2020-09-14 ENCOUNTER — Other Ambulatory Visit: Payer: Self-pay

## 2020-09-14 ENCOUNTER — Encounter (HOSPITAL_COMMUNITY): Payer: Self-pay

## 2020-09-14 ENCOUNTER — Ambulatory Visit (INDEPENDENT_AMBULATORY_CARE_PROVIDER_SITE_OTHER): Payer: Self-pay

## 2020-09-14 DIAGNOSIS — S6701XA Crushing injury of right thumb, initial encounter: Secondary | ICD-10-CM

## 2020-09-14 DIAGNOSIS — S6010XA Contusion of unspecified finger with damage to nail, initial encounter: Secondary | ICD-10-CM

## 2020-09-14 DIAGNOSIS — S60011A Contusion of right thumb without damage to nail, initial encounter: Secondary | ICD-10-CM

## 2020-09-14 MED ORDER — IBUPROFEN 800 MG PO TABS: 800.0000 mg | ORAL_TABLET | Freq: Three times a day (TID) | ORAL | 0 refills | Status: DC

## 2020-09-14 NOTE — ED Triage Notes (Signed)
Pt presents with an injury to the right thumb. Pt states he slammed his thumb in a car door. States it happened 1 hour agp and presents with swelling and bruising.

## 2020-09-14 NOTE — ED Provider Notes (Signed)
[facility]    CSN: [id number] Arrival date & time: 09/14/20  1702      History   Chief Complaint Chief Complaint  Patient presents with   Finger Injury    HPI Brad Kim is a 24 y.o. male.   Patient presenting today with distal right thumb pain after slamming the thumb into the door of his car this afternoon.  He has bruising to the thumb but otherwise is able to move the thumb completely, no superficial nail damage and no numbness or tingling.  His pain is poorly controlled at this time but has not taken anything for pain thus far.  Has not tried anything otherwise over-the-counter.  Past Medical History:  Diagnosis Date   Medical history non-contributory    Right ankle injury    syndesmotic   There are no problems to display for this patient.   Past Surgical History:  Procedure Laterality Date   HARDWARE REMOVAL Right 11/12/2015   Procedure: RIGHT ANKLE HARDWARE REMOVAL;  Surgeon: Cammy Copa, MD;  Location: [facility] OR;  Service: Orthopedics;  Laterality: Right;   NASAL SEPTUM SURGERY     6 yrs ago   ORIF ANKLE FRACTURE Right 08/30/2015   Procedure: OPEN REDUCTION INTERNAL FIXATION (ORIF) ANKLE FRACTURE;  Surgeon: Cammy Copa, MD;  Location: [facility] OR;  Service: Orthopedics;  Laterality: Right;     Home Medications    Prior to Admission medications   Medication Sig Start Date End Date Taking? Authorizing Provider  ibuprofen (ADVIL) 800 MG tablet Take 1 tablet (800 mg total) by mouth 3 (three) times daily. 09/14/20  Yes Particia Nearing, PA-C  oxyCODONE-acetaminophen (ROXICET) 5-325 MG tablet Take 1-2 tablets by mouth every 6 (six) hours as needed for severe pain. Patient not taking: Reported on 11/21/2015 11/12/15   Cammy Copa, MD    Family History History reviewed. No pertinent family history.  Social History Social History   Tobacco Use   Smoking status: Never   Smokeless tobacco: Never  Substance Use Topics   Alcohol use:  No   Drug use: No   Allergies   No known allergies   Review of Systems Review of Systems PER HPI  Physical Exam Triage Vital Signs ED Triage Vitals  Enc Vitals Group     BP 09/14/20 1714 (!) 145/73     Pulse Rate 09/14/20 1714 60     Resp 09/14/20 1714 19     Temp 09/14/20 1714 98.1 F (36.7 C)     Temp Source 09/14/20 1714 Oral     SpO2 09/14/20 1714 98 %     Weight --      Height --      Head Circumference --      Peak Flow --      Pain Score 09/14/20 1713 7     Pain Loc --      Pain Edu? --      Excl. in GC? --    No data found.  Updated Vital Signs BP (!) 145/73 (BP Location: Left Arm)   Pulse 60   Temp 98.1 F (36.7 C) (Oral)   Resp 19   SpO2 98%   Visual Acuity Right Eye Distance:   Left Eye Distance:   Bilateral Distance:    Right Eye Near:   Left Eye Near:    Bilateral Near:     Physical Exam Vitals and nursing note reviewed.  Constitutional:      Appearance: Normal appearance.  HENT:     Head: Atraumatic.  Eyes:     Extraocular Movements: Extraocular movements intact.     Conjunctiva/sclera: Conjunctivae normal.  Cardiovascular:     Rate and Rhythm: Normal rate and regular rhythm.  Pulmonary:     Effort: Pulmonary effort is normal.     Breath sounds: Normal breath sounds.  Musculoskeletal:        General: Normal range of motion.     Cervical back: Normal range of motion and neck supple.     Comments: Trace edema and erythema of distal right thumb.  Subungual hematoma present.  No superficial nail damage.  Good range of motion in the thumb.  Skin:    General: Skin is warm and dry.  Neurological:     General: No focal deficit present.     Mental Status: He is oriented to person, place, and time.     Comments: Right thumb neurovascularly intact  Psychiatric:        Mood and Affect: Mood normal.        Thought Content: Thought content normal.        Judgment: Judgment normal.    UC Treatments / Results  Labs (all labs ordered are  listed, but only abnormal results are displayed) Labs Reviewed - No data to display  EKG   Radiology DG Finger Thumb Right  Result Date: 09/14/2020 CLINICAL DATA:  Slammed thumb in car door 1 hour ago, pain EXAM: RIGHT THUMB 2+V COMPARISON:  None. FINDINGS: Frontal, oblique, lateral views of the right thumb are obtained. No acute displaced fracture, subluxation, or dislocation. Joint spaces are well preserved. Mild soft tissue swelling distal aspect right thumb. IMPRESSION: 1. Soft tissue swelling.  No acute displaced fracture. Electronically Signed   By: Sharlet Salina M.D.   On: 09/14/2020 17:39    Procedures Procedures (including critical care time)  Medications Ordered in UC Medications - No data to display  Initial Impression / Assessment and Plan / UC Course  I have reviewed the triage vital signs and the nursing notes.  Pertinent labs & imaging results that were available during my care of the patient were reviewed by me and considered in my medical decision making (see chart for details).     Right thumb x-ray negative for acute bony abnormality, mild edema in the area and a mild subungual hematoma at the nailbed.  We will forego puncturing given infection risk with shared decision making.  Discussed RICE protocol, ibuprofen as needed.  Follow-up if worsening or not resolving.  Final Clinical Impressions(s) / UC Diagnoses   Final diagnoses:  Subungual hematoma of digit of hand, initial encounter   Discharge Instructions   None    ED Prescriptions     Medication Sig Dispense Auth. Provider   ibuprofen (ADVIL) 800 MG tablet Take 1 tablet (800 mg total) by mouth 3 (three) times daily. 21 tablet Particia Nearing, [place]      PDMP not reviewed this encounter.   Roosvelt Maser [place], [place] 09/15/20 [phone]

## 2022-03-31 ENCOUNTER — Ambulatory Visit (INDEPENDENT_AMBULATORY_CARE_PROVIDER_SITE_OTHER): Payer: Self-pay

## 2022-03-31 ENCOUNTER — Ambulatory Visit (HOSPITAL_COMMUNITY)
Admission: EM | Admit: 2022-03-31 | Discharge: 2022-03-31 | Disposition: A | Discharge status: Home / Self Care | Payer: Self-pay | Attending: Emergency Medicine | Admitting: Emergency Medicine

## 2022-03-31 ENCOUNTER — Encounter (HOSPITAL_COMMUNITY): Payer: Self-pay | Admitting: *Deleted

## 2022-03-31 ENCOUNTER — Ambulatory Visit (HOSPITAL_COMMUNITY): Payer: Self-pay

## 2022-03-31 DIAGNOSIS — M25562 Pain in left knee: Secondary | ICD-10-CM

## 2022-03-31 MED ORDER — IBUPROFEN 800 MG PO TABS: 800.0000 mg | ORAL_TABLET | Freq: Three times a day (TID) | ORAL | 0 refills | Status: AC

## 2022-03-31 NOTE — ED Triage Notes (Signed)
Pt states he has left knee pain X 3 weeks. He states he can put weight on it without pain but can't lift it up. He doesn't know if he hurt it at the gym or playing soccer. He is not taking nay meds for the pain.

## 2022-03-31 NOTE — ED Provider Notes (Signed)
[place]    CSN: [id number] Arrival date & time: 03/31/22  1408      History   Chief Complaint Chief Complaint  Patient presents with   Knee Pain    HPI Brad Kim is a 27 y.o. male.  3 week history of left knee pain Denies trauma or injury but he consistently goes to the gym and plays soccer Pain is worse with squatting, bending, stairs.  No numbness or tingling of the extremity. Pain does not radiate from the knee. Has not taken any medications Tried ACE wrap although noted to be applied very loosely in clinic  History of R leg and ankle injury, but no injury on the left.  Past Medical History:  Diagnosis Date   Medical history non-contributory    Right ankle injury    syndesmotic    There are no problems to display for this patient.   Past Surgical History:  Procedure Laterality Date   HARDWARE REMOVAL Right 11/12/2015   Procedure: RIGHT ANKLE HARDWARE REMOVAL;  Surgeon: Meredith Pel, MD;  Location: [facility];  Service: Orthopedics;  Laterality: Right;   NASAL SEPTUM SURGERY     6 yrs ago   ORIF ANKLE FRACTURE Right 08/30/2015   Procedure: OPEN REDUCTION INTERNAL FIXATION (ORIF) ANKLE FRACTURE;  Surgeon: Meredith Pel, MD;  Location: [facility];  Service: Orthopedics;  Laterality: Right;    Home Medications    Prior to Admission medications   Medication Sig Start Date End Date Taking? Authorizing Provider  ibuprofen (ADVIL) 800 MG tablet Take 1 tablet (800 mg total) by mouth 3 (three) times daily. 03/31/22   Kingsley Farace, Vernice Jefferson    Family History History reviewed. No pertinent family history.  Social History Social History   Tobacco Use   Smoking status: Never   Smokeless tobacco: Never  Vaping Use   Vaping Use: Never used  Substance Use Topics   Alcohol use: No   Drug use: No     Allergies   Patient has no known allergies.   Review of Systems Review of Systems As per HPI  Physical Exam Triage Vital Signs ED Triage  Vitals  Enc Vitals Group     BP 03/31/22 1452 (!) 143/79     Pulse Rate 03/31/22 1452 64     Resp 03/31/22 1452 18     Temp 03/31/22 1452 98 F (36.7 C)     Temp Source 03/31/22 1452 Oral     SpO2 03/31/22 1452 98 %     Weight --      Height --      Head Circumference --      Peak Flow --      Pain Score 03/31/22 1451 0     Pain Loc --      Pain Edu? --      Excl. in [place]? --    No data found.  Updated Vital Signs BP (!) 143/79 (BP Location: Left Arm)   Pulse 64   Temp 98 F (36.7 C) (Oral)   Resp 18   SpO2 98%     Physical Exam Vitals and nursing note reviewed.  Constitutional:      General: He is not in acute distress. Cardiovascular:     Rate and Rhythm: Normal rate and regular rhythm.     Pulses: Normal pulses.  Pulmonary:     Effort: Pulmonary effort is normal.  Musculoskeletal:     Right knee: Normal.     Left  knee: Crepitus present. No swelling, deformity or bony tenderness. Decreased range of motion. Normal pulse.     Comments: Pain with forward extension of the L knee.   Skin:    General: Skin is warm and dry.  Neurological:     General: No focal deficit present.     Mental Status: He is alert and oriented to person, place, and time.     Gait: Gait normal.     Comments: Sensation intact bilat LE. Strong pulse. Strength 4/5 LLE, 5/5 RLE     UC Treatments / Results  Labs (all labs ordered are listed, but only abnormal results are displayed) Labs Reviewed - No data to display  EKG   Radiology DG Knee AP/LAT W/[place] Left  Result Date: 03/31/2022 CLINICAL DATA:  Left knee pain. EXAM: LEFT KNEE 3 VIEWS COMPARISON:  None Available. FINDINGS: The joint spaces are maintained. No acute bony findings. No joint effusion. IMPRESSION: Normal left knee radiographs. Electronically Signed   By: Marijo Sanes M.D.   On: 03/31/2022 15:46    Procedures Procedures (including critical care time)  Medications Ordered in UC Medications - No data to  display  Initial Impression / Assessment and Plan / UC Course  I have reviewed the triage vital signs and the nursing notes.  Pertinent labs & imaging results that were available during my care of the patient were reviewed by me and considered in my medical decision making (see chart for details).  Discussed likely soft tissue injury; tendon vs ligament vs patellofemoral pain syndrome Discussed xray would not likely show anything but patient would like to make sure Imaging negative.  Discussed RICE therapy. Provided with supportive brace. Advised to avoid strenuous activity and take a break from squatting. Follow with ortho if symptoms persisting.  Final Clinical Impressions(s) / UC Diagnoses   Final diagnoses:  Acute pain of left knee     Discharge Instructions      Xray was NEGATIVE. As discussed you likely have a soft tissue injury.  Rest - try to avoid heavy lifting and high impact activity. Take a break from squatting. Your injury will not heal if you continue to overuse the knee.  Ice - apply for 20 minutes a few times daily  Compression - use knee brace as needed when walking and standing  Elevation - prop up on a pillow  Ibuprofen 800 mg 3x daily for the next several days  If symptoms persist despite trying these interventions for the next week or so, please follow with the orthopedic specialist. Call to make appointment.      ED Prescriptions     Medication Sig Dispense Auth. Provider   ibuprofen (ADVIL) 800 MG tablet Take 1 tablet (800 mg total) by mouth 3 (three) times daily. 21 tablet Yurem Viner, Wells Guiles, PA-C      PDMP not reviewed this encounter.   [place], [place] 03/31/22 1551

## 2022-03-31 NOTE — Discharge Instructions (Addendum)
Xray was NEGATIVE. As discussed you likely have a soft tissue injury.  Rest - try to avoid heavy lifting and high impact activity. Take a break from squatting. Your injury will not heal if you continue to overuse the knee.  Ice - apply for 20 minutes a few times daily  Compression - use knee brace as needed when walking and standing  Elevation - prop up on a pillow  Ibuprofen 800 mg 3x daily for the next several days  If symptoms persist despite trying these interventions for the next week or so, please follow with the orthopedic specialist. Call to make appointment.

## 2023-07-24 IMAGING — DX DG FINGER THUMB 2+V*R*
3 series · 3 of 3 positions shown · non-contrast
Comparison: None.

CLINICAL DATA: Slammed thumb in car door 1 hour ago, pain

EXAM:
RIGHT THUMB 2+V

[finger ap]
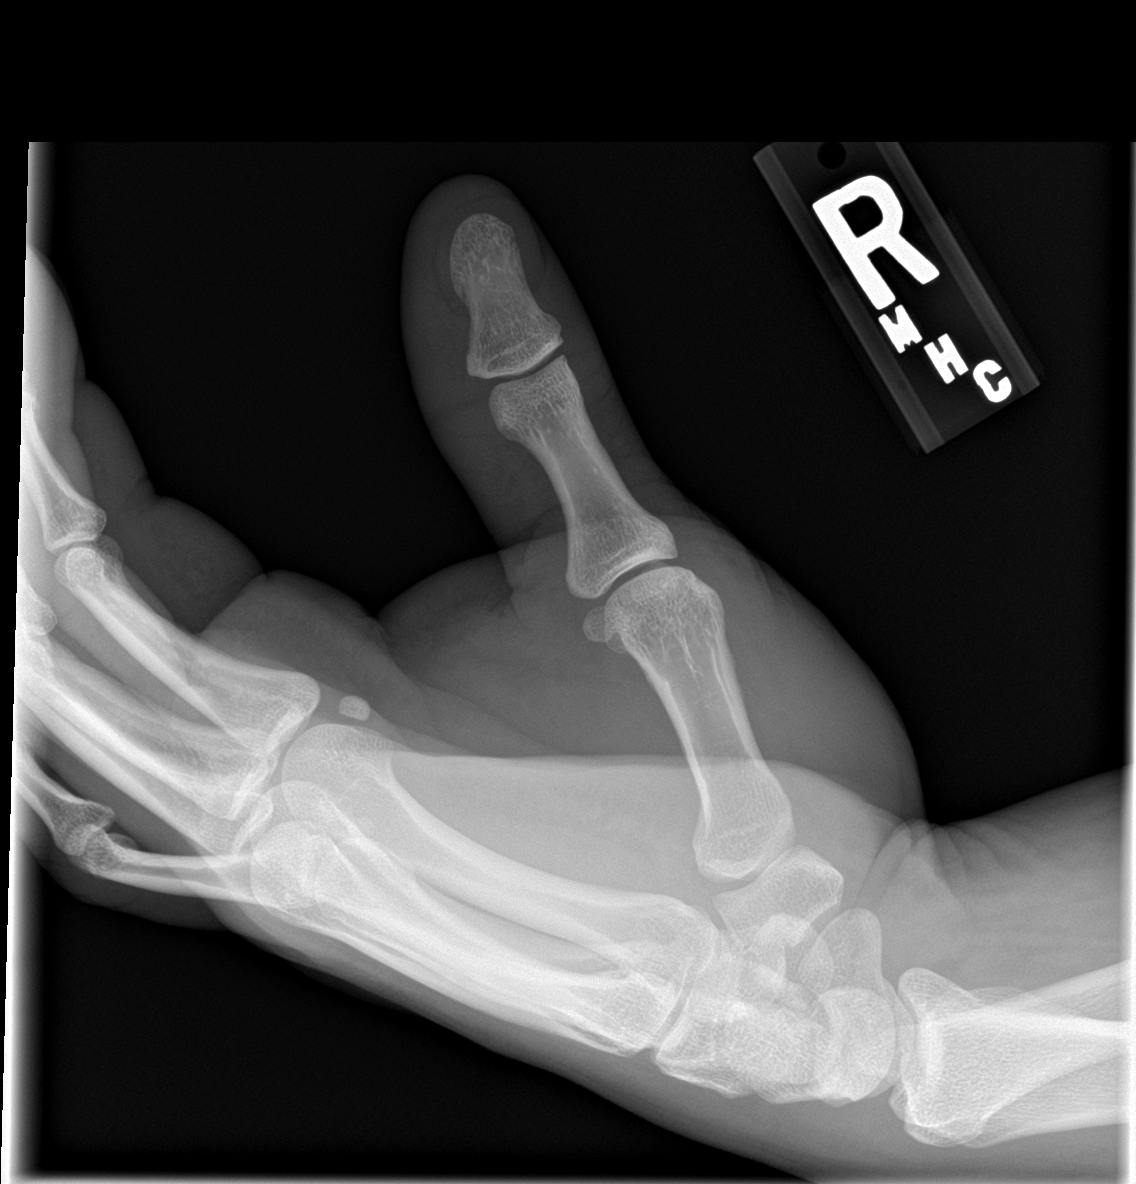

[finger obl]
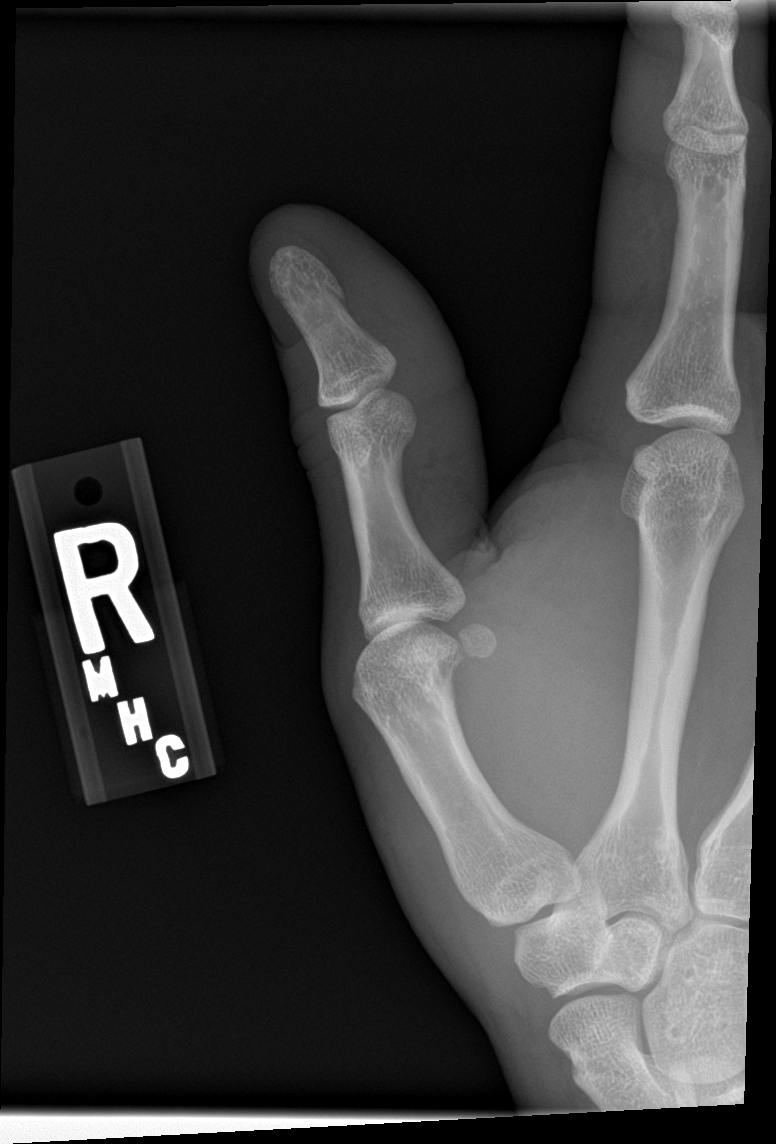

[finger lat]
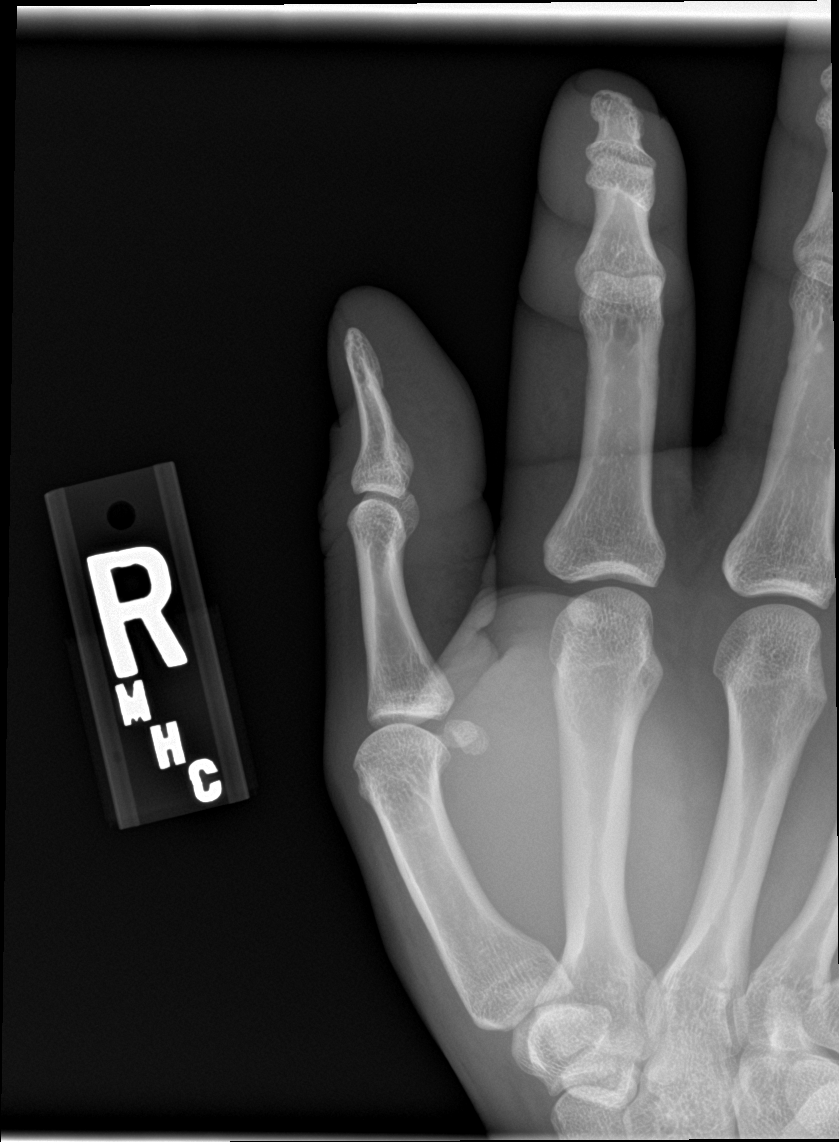

[3 of 3 positions shown; findings below may reference images not displayed]

FINDINGS: Frontal, oblique, lateral views of the right thumb are obtained. No
acute displaced fracture, subluxation, or dislocation. Joint spaces
are well preserved. Mild soft tissue swelling distal aspect right
thumb.
IMPRESSION: 1. Soft tissue swelling.  No acute displaced fracture.
